# Patient Record
Sex: Female | Born: 1985 | Race: Black or African American | Hispanic: No | Marital: Single | Smoking: Never smoker
Health system: Southern US, Community
[De-identification: ages and names within clinical notes are randomized; demographics above are authoritative.]

## PROBLEM LIST (undated history)

## (undated) DIAGNOSIS — J45909 Unspecified asthma, uncomplicated: Secondary | ICD-10-CM

---

## 2007-09-08 ENCOUNTER — Emergency Department (HOSPITAL_COMMUNITY): Admission: EM | Admit: 2007-09-08 | Discharge: 2007-09-08 | Payer: Self-pay | Admitting: Emergency Medicine

## 2008-09-05 ENCOUNTER — Emergency Department (HOSPITAL_COMMUNITY): Admission: EM | Admit: 2008-09-05 | Discharge: 2008-09-06 | Payer: Self-pay | Admitting: Emergency Medicine

## 2009-11-07 ENCOUNTER — Emergency Department (HOSPITAL_COMMUNITY): Admission: EM | Admit: 2009-11-07 | Discharge: 2009-11-07 | Payer: Self-pay | Admitting: Family Medicine

## 2010-01-22 ENCOUNTER — Emergency Department (HOSPITAL_COMMUNITY): Admission: EM | Admit: 2010-01-22 | Discharge: 2010-01-22 | Payer: Self-pay | Admitting: Emergency Medicine

## 2010-05-20 ENCOUNTER — Emergency Department (HOSPITAL_COMMUNITY): Admission: EM | Admit: 2010-05-20 | Discharge: 2010-05-20 | Payer: Self-pay | Admitting: Family Medicine

## 2011-03-15 LAB — POCT URINALYSIS DIP (DEVICE)
Glucose, UA: NEGATIVE mg/dL
Ketones, ur: NEGATIVE mg/dL
Protein, ur: NEGATIVE mg/dL
Urobilinogen, UA: 2 mg/dL — ABNORMAL HIGH (ref 0.0–1.0)

## 2011-03-15 LAB — POCT PREGNANCY, URINE: Preg Test, Ur: NEGATIVE

## 2011-03-15 LAB — WET PREP, GENITAL: Yeast Wet Prep HPF POC: NONE SEEN

## 2011-03-15 LAB — GC/CHLAMYDIA PROBE AMP, GENITAL: Chlamydia, DNA Probe: NEGATIVE

## 2011-03-16 LAB — WET PREP, GENITAL
Trich, Wet Prep: NONE SEEN
Yeast Wet Prep HPF POC: NONE SEEN

## 2011-03-16 LAB — POCT URINALYSIS DIP (DEVICE)
Bilirubin Urine: NEGATIVE
Glucose, UA: NEGATIVE mg/dL
Hgb urine dipstick: NEGATIVE
Ketones, ur: NEGATIVE mg/dL
Nitrite: NEGATIVE
Protein, ur: NEGATIVE mg/dL
Specific Gravity, Urine: 1.025 (ref 1.005–1.030)
Urobilinogen, UA: 0.2 mg/dL (ref 0.0–1.0)
pH: 6 (ref 5.0–8.0)

## 2011-03-16 LAB — GC/CHLAMYDIA PROBE AMP, GENITAL
Chlamydia, DNA Probe: NEGATIVE
GC Probe Amp, Genital: NEGATIVE

## 2011-03-16 LAB — POCT PREGNANCY, URINE: Preg Test, Ur: NEGATIVE

## 2011-04-26 ENCOUNTER — Emergency Department (HOSPITAL_COMMUNITY)
Admission: EM | Admit: 2011-04-26 | Discharge: 2011-04-27 | Disposition: A | Discharge status: Home / Self Care | Payer: Self-pay | Attending: Emergency Medicine | Admitting: Emergency Medicine

## 2011-04-26 DIAGNOSIS — F3289 Other specified depressive episodes: Secondary | ICD-10-CM | POA: Insufficient documentation

## 2011-04-26 DIAGNOSIS — F329 Major depressive disorder, single episode, unspecified: Secondary | ICD-10-CM | POA: Insufficient documentation

## 2011-04-26 DIAGNOSIS — E876 Hypokalemia: Secondary | ICD-10-CM | POA: Insufficient documentation

## 2011-04-26 LAB — URINALYSIS, ROUTINE W REFLEX MICROSCOPIC
Nitrite: NEGATIVE
Specific Gravity, Urine: 1.011 (ref 1.005–1.030)
pH: 7.5 (ref 5.0–8.0)

## 2011-04-26 LAB — CBC
MCH: 23.1 pg — ABNORMAL LOW (ref 26.0–34.0)
MCV: 73 fL — ABNORMAL LOW (ref 78.0–100.0)
Platelets: 281 10*3/uL (ref 150–400)
RBC: 5.29 MIL/uL — ABNORMAL HIGH (ref 3.87–5.11)

## 2011-04-26 LAB — DIFFERENTIAL
Eosinophils Absolute: 0.1 10*3/uL (ref 0.0–0.7)
Eosinophils Relative: 3 % (ref 0–5)
Lymphs Abs: 2.5 10*3/uL (ref 0.7–4.0)
Monocytes Relative: 7 % (ref 3–12)
Neutrophils Relative %: 41 % — ABNORMAL LOW (ref 43–77)

## 2011-04-26 LAB — COMPREHENSIVE METABOLIC PANEL
Albumin: 3.6 g/dL (ref 3.5–5.2)
BUN: 7 mg/dL (ref 6–23)
CO2: 22 mEq/L (ref 19–32)
Chloride: 105 mEq/L (ref 96–112)
Creatinine, Ser: 0.76 mg/dL (ref 0.4–1.2)
GFR calc non Af Amer: >60 mL/min (ref >60)
Glucose, Bld: 81 mg/dL (ref 70–99)
Total Bilirubin: 0.4 mg/dL (ref 0.3–1.2)

## 2011-04-26 LAB — POCT PREGNANCY, URINE: Preg Test, Ur: NEGATIVE

## 2011-04-26 LAB — RAPID URINE DRUG SCREEN, HOSP PERFORMED
Cocaine: NOT DETECTED
Opiates: NOT DETECTED

## 2011-04-26 LAB — ETHANOL: Alcohol, Ethyl (B): <5 mg/dL (ref 0–10)

## 2011-09-30 LAB — POCT I-STAT, CHEM 8
BUN: 26 — ABNORMAL HIGH
Creatinine, Ser: 0.9
Potassium: 4
Sodium: 138

## 2011-09-30 LAB — D-DIMER, QUANTITATIVE: D-Dimer, Quant: 0.33

## 2011-09-30 LAB — URINALYSIS, ROUTINE W REFLEX MICROSCOPIC
Bilirubin Urine: NEGATIVE
Nitrite: NEGATIVE
Protein, ur: NEGATIVE
Specific Gravity, Urine: 1.021
Urobilinogen, UA: 1

## 2011-09-30 LAB — POCT CARDIAC MARKERS
CKMB, poc: 1.2
Myoglobin, poc: 33.7
Troponin i, poc: <0.05

## 2011-09-30 LAB — DIFFERENTIAL
Lymphocytes Relative: 44
Lymphs Abs: 2.5
Monocytes Relative: 7
Neutro Abs: 2.5
Neutrophils Relative %: 44

## 2011-09-30 LAB — POCT PREGNANCY, URINE: Preg Test, Ur: NEGATIVE

## 2011-09-30 LAB — URINE MICROSCOPIC-ADD ON

## 2011-09-30 LAB — CBC
RBC: 5.32 — ABNORMAL HIGH
WBC: 5.7

## 2012-03-20 ENCOUNTER — Emergency Department (HOSPITAL_COMMUNITY): Payer: BC Managed Care – PPO

## 2012-03-20 ENCOUNTER — Encounter (HOSPITAL_COMMUNITY): Payer: Self-pay | Admitting: *Deleted

## 2012-03-20 ENCOUNTER — Emergency Department (HOSPITAL_COMMUNITY)
Admission: EM | Admit: 2012-03-20 | Discharge: 2012-03-20 | Disposition: A | Discharge status: Home / Self Care | Payer: BC Managed Care – PPO | Attending: Emergency Medicine | Admitting: Emergency Medicine

## 2012-03-20 DIAGNOSIS — M545 Low back pain, unspecified: Secondary | ICD-10-CM | POA: Insufficient documentation

## 2012-03-20 MED ORDER — IBUPROFEN 800 MG PO TABS: 800.0000 mg | ORAL_TABLET | Freq: Three times a day (TID) | ORAL | Status: AC

## 2012-03-20 MED ORDER — DIAZEPAM 5 MG PO TABS: 5.0000 mg | ORAL_TABLET | Freq: Two times a day (BID) | ORAL | Status: AC
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
## 2012-03-20 NOTE — ED Provider Notes (Signed)
Medical screening examination/treatment/procedure(s) were performed by non-physician practitioner and as supervising physician I was immediately available for consultation/collaboration.   Carleene Cooper III, MD 03/20/12 1700
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
## 2012-09-17 ENCOUNTER — Encounter (HOSPITAL_COMMUNITY): Payer: Self-pay | Admitting: Emergency Medicine

## 2012-09-17 ENCOUNTER — Emergency Department (INDEPENDENT_AMBULATORY_CARE_PROVIDER_SITE_OTHER)
Admission: EM | Admit: 2012-09-17 | Discharge: 2012-09-17 | Disposition: A | Discharge status: Home / Self Care | Payer: BC Managed Care – PPO | Source: Home / Self Care

## 2012-09-17 DIAGNOSIS — R079 Chest pain, unspecified: Secondary | ICD-10-CM

## 2012-09-17 DIAGNOSIS — R0989 Other specified symptoms and signs involving the circulatory and respiratory systems: Secondary | ICD-10-CM

## 2012-09-17 DIAGNOSIS — R064 Hyperventilation: Secondary | ICD-10-CM

## 2012-09-17 DIAGNOSIS — R002 Palpitations: Secondary | ICD-10-CM

## 2012-09-17 DIAGNOSIS — R0600 Dyspnea, unspecified: Secondary | ICD-10-CM

## 2012-09-17 DIAGNOSIS — R0609 Other forms of dyspnea: Secondary | ICD-10-CM

## 2012-09-17 HISTORY — DX: Unspecified asthma, uncomplicated: J45.909

## 2012-09-17 MED ORDER — PROPRANOLOL HCL 10 MG PO TABS: 10.0000 mg | ORAL_TABLET | Freq: Three times a day (TID) | ORAL | Status: DC

## 2012-09-17 NOTE — ED Provider Notes (Signed)
History     CSN: [id number]  Arrival date & time 09/17/12  1429   None     Chief Complaint  Patient presents with  . 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 Yesterday she experienced an episode of shortness of breath that was worse with talking this occurs mostly when she zonal phone at other times as well. Yesterday's episode was associated with palpitations and chest pain is described as heavy and sharp and fleeting. As this group of symptoms progress she did develop tingling in the extremities and began to feel lightheaded as if she was going to faint. All of these symptoms occur primarily at work but she has also experienced a few at home. Once while just lying down on the bed and another time watching television. She describes the palpitations as an irregular heartbeat.  After she improved with her symptoms yesterday some of the same symptoms began as she restarted to work the following day. She denies that she is under excessive stress at work.   Past Medical History  Diagnosis Date  . Asthma     childhood asthma    History reviewed. No pertinent past surgical history.  Family History  Problem Relation Age of Onset  . Rheum arthritis Other   . Thyroid disease Other   . Hypertension Other     History  Substance Use Topics  . Smoking status: Never Smoker   . Smokeless tobacco: Never Used  . Alcohol Use: Yes     socially    OB History    Grav Para Term Preterm Abortions TAB SAB Ect Mult Living                  Review of Systems  Constitutional: Negative for chills, activity change, appetite change and fatigue.  HENT: Negative.   Eyes: Negative.   Respiratory: Positive for chest tightness and shortness of breath. Negative for cough, wheezing and stridor.     Cardiovascular: Positive for chest pain and palpitations. Negative for leg swelling.  Gastrointestinal: Negative.   Genitourinary: Negative.   Skin: Negative.   Neurological: Positive for dizziness, weakness, light-headedness and numbness. Negative for tremors, seizures, syncope, facial asymmetry, speech difficulty and headaches.    Allergies  Review of patient's allergies indicates no known allergies.  Home Medications   Current Outpatient Rx  Name Route Sig Dispense Refill  . LEVONORGESTREL-ETHINYL ESTRAD 0.15-30 MG-MCG PO TABS Oral Take 1 tablet by mouth daily.    . ADULT MULTIVITAMIN W/MINERALS CH Oral Take 1 tablet by mouth daily.    Marland Kitchen PROPRANOLOL HCL 10 MG PO TABS Oral Take 1 tablet (10 mg total) by mouth 3 (three) times daily. 90 tablet 0    BP 125/71  Pulse 81  Temp 98.8 F (37.1 C) (Oral)  Resp 18  SpO2 100%  LMP 09/16/2012  Physical Exam  Constitutional: She is oriented to person, place, and time. She appears well-developed and well-nourished. No distress.  HENT:  Head: Normocephalic and atraumatic.  Mouth/Throat: Oropharynx is clear and moist. No oropharyngeal exudate.  Eyes: Conjunctivae normal and EOM are normal. Pupils are equal, round, and reactive to light.  Neck: Normal range of motion. Neck supple. No JVD present. No thyromegaly present.  Cardiovascular: Normal rate and normal heart sounds.   Pulmonary/Chest: Effort normal and breath sounds normal. No  respiratory distress. She has no wheezes. She has no rales. She exhibits tenderness.  Abdominal: Soft. There is no tenderness.  Musculoskeletal: Normal range of motion. She exhibits no edema and no tenderness.  Lymphadenopathy:    She has no cervical adenopathy.  Neurological: She is alert and oriented to person, place, and time. No cranial nerve deficit.  Skin: Skin is warm and dry. No rash noted. No erythema.  Psychiatric: She has a normal mood and affect.    ED Course  Procedures (including critical  care time)  Labs Reviewed - No data to display No results found.   1. Hyperventilation   2. Dyspnea   3. Heart palpitations   4. Chest pain       MDM  Reassurance Propanolol 10mg  tid prn. EKG: NSR, So ST-T changes, no signs of ischemia, no premature beats.  Refer to [facility]. May need TFT's and Event monitoring.  If reccurs go to ED so it can be monitored and diagnoses.         Hayden Rasmussen, NP 09/17/12 1649

## 2012-09-17 NOTE — ED Notes (Signed)
Pt c/o of sob since Friday. Experiencing tingling in hands,legs, and arms. Some chest tightness and feeling light headed. Pt states that these symptoms often come and go and tends to happen frequently. Pt states that symptoms are worse today having sharp pain off/on in chest and feels like heart is fluttering.

## 2012-09-18 NOTE — ED Provider Notes (Signed)
Medical screening examination/treatment/procedure(s) were performed by non-physician practitioner and as supervising physician I was immediately available for consultation/collaboration.  Leslee Home, M.D.   Reuben Likes, MD 09/18/12 [phone]

## 2013-01-24 ENCOUNTER — Ambulatory Visit (INDEPENDENT_AMBULATORY_CARE_PROVIDER_SITE_OTHER): Payer: BC Managed Care – PPO | Admitting: Physician Assistant

## 2013-01-24 VITALS — BP 130/76 | HR 71 | Temp 98.4°F | Resp 16

## 2013-01-24 DIAGNOSIS — Z23 Encounter for immunization: Secondary | ICD-10-CM

## 2013-01-24 DIAGNOSIS — Z0289 Encounter for other administrative examinations: Secondary | ICD-10-CM
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
## 2013-11-27 ENCOUNTER — Emergency Department (HOSPITAL_COMMUNITY)
Admission: EM | Admit: 2013-11-27 | Discharge: 2013-11-27 | Disposition: A | Discharge status: Home / Self Care | Payer: BC Managed Care – PPO | Attending: Emergency Medicine | Admitting: Emergency Medicine

## 2013-11-27 ENCOUNTER — Encounter (HOSPITAL_COMMUNITY): Payer: Self-pay | Admitting: Emergency Medicine

## 2013-11-27 DIAGNOSIS — R632 Polyphagia: Secondary | ICD-10-CM | POA: Insufficient documentation

## 2013-11-27 DIAGNOSIS — H538 Other visual disturbances: Secondary | ICD-10-CM | POA: Insufficient documentation

## 2013-11-27 DIAGNOSIS — Z79899 Other long term (current) drug therapy: Secondary | ICD-10-CM | POA: Insufficient documentation

## 2013-11-27 DIAGNOSIS — R42 Dizziness and giddiness: Secondary | ICD-10-CM | POA: Insufficient documentation

## 2013-11-27 DIAGNOSIS — R631 Polydipsia: Secondary | ICD-10-CM | POA: Insufficient documentation

## 2013-11-27 DIAGNOSIS — R635 Abnormal weight gain: Secondary | ICD-10-CM | POA: Insufficient documentation

## 2013-11-27 DIAGNOSIS — R55 Syncope and collapse: Secondary | ICD-10-CM

## 2013-11-27 DIAGNOSIS — R350 Frequency of micturition: Secondary | ICD-10-CM | POA: Insufficient documentation

## 2013-11-27 DIAGNOSIS — Z3202 Encounter for pregnancy test, result negative: Secondary | ICD-10-CM | POA: Insufficient documentation

## 2013-11-27 LAB — URINALYSIS, ROUTINE W REFLEX MICROSCOPIC
Glucose, UA: NEGATIVE mg/dL
Hgb urine dipstick: NEGATIVE
Leukocytes, UA: NEGATIVE
Protein, ur: NEGATIVE mg/dL
pH: 6 (ref 5.0–8.0)

## 2013-11-27 LAB — BASIC METABOLIC PANEL
CO2: 23 mEq/L (ref 19–32)
Calcium: 9.3 mg/dL (ref 8.4–10.5)
Chloride: 100 mEq/L (ref 96–112)
Glucose, Bld: 92 mg/dL (ref 70–99)
Sodium: 135 mEq/L (ref 135–145)

## 2013-11-27 LAB — CBC WITH DIFFERENTIAL/PLATELET
Basophils Absolute: 0 10*3/uL (ref 0.0–0.1)
Eosinophils Absolute: 0.2 10*3/uL (ref 0.0–0.7)
Lymphocytes Relative: 36 % (ref 12–46)
MCHC: 30.4 g/dL (ref 30.0–36.0)
Monocytes Relative: 6 % (ref 3–12)
Neutro Abs: 2.6 10*3/uL (ref 1.7–7.7)
Neutrophils Relative %: 54 % (ref 43–77)
Platelets: 356 10*3/uL (ref 150–400)
RDW: 15.6 % — ABNORMAL HIGH (ref 11.5–15.5)
WBC: 4.9 10*3/uL (ref 4.0–10.5)

## 2013-11-27 LAB — POCT PREGNANCY, URINE: Preg Test, Ur: NEGATIVE

## 2013-11-27 LAB — GLUCOSE, CAPILLARY: Glucose-Capillary: 86 mg/dL (ref 70–99)

## 2013-11-27 NOTE — ED Notes (Signed)
Patient c/o headache, near syncope, frequent urination, excessive thirst, and blurred vision. Patient reports a history of diabetes in her family.

## 2013-11-27 NOTE — ED Provider Notes (Signed)
CSN: [id number]     Arrival date & time 11/27/13  1141 History   First MD Initiated Contact with Patient 11/27/13 1345     Chief Complaint  Patient presents with  . Near Syncope  . Urinary Frequency   (Consider location/radiation/quality/duration/timing/severity/associated sxs/prior Treatment) HPI... episode of lightheadedness and dizziness at work today. Patient also complains of polydipsia and polyphagia.  Last menstrual period 2 weeks ago. No chronic health problems. She is normally healthy. Severity is mild. Nothing makes symptoms better or worse. No neuro deficits, fever, chills, dysuria.  Does complain of weight gain  Past Medical History  Diagnosis Date  . Asthma     childhood asthma   History reviewed. No pertinent past surgical history. Family History  Problem Relation Age of Onset  . Rheum arthritis Other   . Thyroid disease Other   . Hypertension Other    History  Substance Use Topics  . Smoking status: Never Smoker   . Smokeless tobacco: Never Used  . Alcohol Use: Yes     Comment: socially   OB History   Grav Para Term Preterm Abortions TAB SAB Ect Mult Living                 Review of Systems  All other systems reviewed and are negative.    Allergies  Review of patient's allergies indicates no known allergies.  Home Medications   Current Outpatient Rx  Name  Route  Sig  Dispense  Refill  . levonorgestrel-ethinyl estradiol (NORDETTE) 0.15-30 MG-MCG tablet   Oral   Take 1 tablet by mouth daily.         . Multiple Vitamin (MULITIVITAMIN WITH MINERALS) TABS   Oral   Take 1 tablet by mouth daily.          BP 134/84  Pulse 80  Temp(Src) 98.6 F (37 C) (Oral)  Resp 16  SpO2 100%  LMP 11/14/2013 Physical Exam  Nursing note and vitals reviewed. Constitutional: She is oriented to person, place, and time. She appears well-developed and well-nourished.  HENT:  Head: Normocephalic and atraumatic.  Eyes: Conjunctivae and EOM are normal. Pupils  are equal, round, and reactive to light.  Neck: Normal range of motion. Neck supple.  Cardiovascular: Normal rate, regular rhythm and normal heart sounds.   Pulmonary/Chest: Effort normal and breath sounds normal.  Abdominal: Soft. Bowel sounds are normal.  Musculoskeletal: Normal range of motion.  Neurological: She is alert and oriented to person, place, and time.  Skin: Skin is warm and dry.  Psychiatric: She has a normal mood and affect. Her behavior is normal.    ED Course  Procedures (including critical care time) Labs Review Labs Reviewed  BASIC METABOLIC PANEL - Abnormal; Notable for the following:    Potassium 3.4 (*)    All other components within normal limits  URINALYSIS, ROUTINE W REFLEX MICROSCOPIC - Abnormal; Notable for the following:    Color, Urine STRAW (*)    APPearance CLOUDY (*)    All other components within normal limits  CBC WITH DIFFERENTIAL - Abnormal; Notable for the following:    RBC 5.57 (*)    MCV 72.0 (*)    MCH 21.9 (*)    RDW 15.6 (*)    All other components within normal limits  GLUCOSE, CAPILLARY  POCT PREGNANCY, URINE   Imaging Review No results found.  EKG Interpretation    Date/Time:  Monday November 27 2013 13:56:54 EST Ventricular Rate:  77 PR Interval:  135  QRS Duration: 84 QT Interval:  369 QTC Calculation: 418 R Axis:   61 Text Interpretation:  Sinus arrhythmia No significant change since last tracing Confirmed by STEINL  MD, KEVIN ([id number]) on 11/27/2013 2:14:58 PM            MDM   1. Near syncope    Normal physical exam.  Patient does not appear ill.  Screening labs, urinalysis, pregnancy test, EKG all normal    Donnetta Hutching, MD 11/27/13 1452

## 2014-11-29 ENCOUNTER — Emergency Department (HOSPITAL_COMMUNITY): Payer: BC Managed Care – PPO

## 2014-11-29 ENCOUNTER — Encounter (HOSPITAL_COMMUNITY): Payer: Self-pay | Admitting: Emergency Medicine

## 2014-11-29 ENCOUNTER — Emergency Department (HOSPITAL_COMMUNITY)
Admission: EM | Admit: 2014-11-29 | Discharge: 2014-11-29 | Disposition: A | Discharge status: Home / Self Care | Payer: BC Managed Care – PPO | Attending: Emergency Medicine | Admitting: Emergency Medicine

## 2014-11-29 DIAGNOSIS — Z3202 Encounter for pregnancy test, result negative: Secondary | ICD-10-CM | POA: Diagnosis not present

## 2014-11-29 DIAGNOSIS — R5381 Other malaise: Secondary | ICD-10-CM

## 2014-11-29 DIAGNOSIS — Z79899 Other long term (current) drug therapy: Secondary | ICD-10-CM | POA: Insufficient documentation

## 2014-11-29 DIAGNOSIS — R079 Chest pain, unspecified: Secondary | ICD-10-CM | POA: Diagnosis not present

## 2014-11-29 DIAGNOSIS — J45901 Unspecified asthma with (acute) exacerbation: Secondary | ICD-10-CM | POA: Diagnosis not present

## 2014-11-29 DIAGNOSIS — R1011 Right upper quadrant pain: Secondary | ICD-10-CM | POA: Diagnosis not present

## 2014-11-29 DIAGNOSIS — N390 Urinary tract infection, site not specified: Secondary | ICD-10-CM | POA: Insufficient documentation

## 2014-11-29 DIAGNOSIS — Z793 Long term (current) use of hormonal contraceptives: Secondary | ICD-10-CM | POA: Diagnosis not present

## 2014-11-29 DIAGNOSIS — R0602 Shortness of breath: Secondary | ICD-10-CM

## 2014-11-29 DIAGNOSIS — M791 Myalgia: Secondary | ICD-10-CM | POA: Insufficient documentation

## 2014-11-29 DIAGNOSIS — R110 Nausea: Secondary | ICD-10-CM | POA: Diagnosis present

## 2014-11-29 LAB — CBC WITH DIFFERENTIAL/PLATELET
BASOS ABS: 0 10*3/uL (ref 0.0–0.1)
Basophils Relative: 0 % (ref 0–1)
EOS PCT: 1 % (ref 0–5)
Eosinophils Absolute: 0.1 10*3/uL (ref 0.0–0.7)
HEMATOCRIT: 43.1 % (ref 36.0–46.0)
Hemoglobin: 13.8 g/dL (ref 12.0–15.0)
LYMPHS PCT: 25 % (ref 12–46)
Lymphs Abs: 2.2 10*3/uL (ref 0.7–4.0)
MCH: 25 pg — ABNORMAL LOW (ref 26.0–34.0)
MCHC: 32 g/dL (ref 30.0–36.0)
MCV: 78.1 fL (ref 78.0–100.0)
MONO ABS: 0.7 10*3/uL (ref 0.1–1.0)
MONOS PCT: 7 % (ref 3–12)
Neutro Abs: 6 10*3/uL (ref 1.7–7.7)
Neutrophils Relative %: 67 % (ref 43–77)
Platelets: 265 10*3/uL (ref 150–400)
RBC: 5.52 MIL/uL — ABNORMAL HIGH (ref 3.87–5.11)
RDW: 15 % (ref 11.5–15.5)
WBC: 9 10*3/uL (ref 4.0–10.5)

## 2014-11-29 LAB — COMPREHENSIVE METABOLIC PANEL
ALT: 14 U/L (ref 0–35)
ANION GAP: 13 (ref 5–15)
AST: 27 U/L (ref 0–37)
Albumin: 3.6 g/dL (ref 3.5–5.2)
Alkaline Phosphatase: 40 U/L (ref 39–117)
BUN: 7 mg/dL (ref 6–23)
CALCIUM: 9.2 mg/dL (ref 8.4–10.5)
CO2: 23 meq/L (ref 19–32)
CREATININE: 0.81 mg/dL (ref 0.50–1.10)
Chloride: 101 mEq/L (ref 96–112)
Glucose, Bld: 84 mg/dL (ref 70–99)
Potassium: 4.5 mEq/L (ref 3.7–5.3)
Sodium: 137 mEq/L (ref 137–147)
Total Bilirubin: 0.2 mg/dL — ABNORMAL LOW (ref 0.3–1.2)
Total Protein: 7.6 g/dL (ref 6.0–8.3)

## 2014-11-29 LAB — URINALYSIS, ROUTINE W REFLEX MICROSCOPIC
Bilirubin Urine: NEGATIVE
GLUCOSE, UA: NEGATIVE mg/dL
Ketones, ur: NEGATIVE mg/dL
Leukocytes, UA: NEGATIVE
Nitrite: NEGATIVE
PROTEIN: NEGATIVE mg/dL
Specific Gravity, Urine: 1.006 (ref 1.005–1.030)
UROBILINOGEN UA: 0.2 mg/dL (ref 0.0–1.0)
pH: 6.5 (ref 5.0–8.0)

## 2014-11-29 LAB — URINE MICROSCOPIC-ADD ON

## 2014-11-29 LAB — POC URINE PREG, ED: PREG TEST UR: NEGATIVE

## 2014-11-29 MED ORDER — CIPROFLOXACIN HCL 500 MG PO TABS
500.0000 mg | ORAL_TABLET | Freq: Two times a day (BID) | ORAL | Status: AC
Start: 2014-11-29 — End: ?

## 2014-11-29 MED ORDER — SODIUM CHLORIDE 0.9 % IV BOLUS (SEPSIS)
1000.0000 mL | Freq: Once | INTRAVENOUS | Status: AC
  Administered 2014-11-29: 1000 mL via INTRAVENOUS

## 2014-11-29 MED ORDER — KETOROLAC TROMETHAMINE 30 MG/ML IJ SOLN
30.0000 mg | Freq: Once | INTRAMUSCULAR | Status: AC
  Administered 2014-11-29: 30 mg via INTRAVENOUS
  Filled 2014-11-29: qty 1

## 2014-11-29 MED ORDER — OXYCODONE-ACETAMINOPHEN 5-325 MG PO TABS: 1.0000 | ORAL_TABLET | Freq: Four times a day (QID) | ORAL | Status: AC | PRN

## 2014-11-29 MED ORDER — IOHEXOL 300 MG/ML  SOLN
100.0000 mL | Freq: Once | INTRAMUSCULAR | Status: AC | PRN
  Administered 2014-11-29: 100 mL via INTRAVENOUS

## 2014-11-29 NOTE — ED Provider Notes (Addendum)
CSN: [id number]     Arrival date & time 11/29/14  1353 History   First MD Initiated Contact with Patient 11/29/14 1450     Chief Complaint  Patient presents with  . Flank Pain    right  . Nausea     (Consider location/radiation/quality/duration/timing/severity/associated sxs/prior Treatment) Patient is a 28 y.o. female presenting with flank pain. The history is provided by the patient.  Flank Pain This is a new problem. The current episode started 3 to 5 hours ago. The problem occurs constantly. The problem has not changed since onset.Associated symptoms include chest pain, abdominal pain and shortness of breath. Pertinent negatives include no headaches. Nothing aggravates the symptoms. Nothing relieves the symptoms. She has tried nothing for the symptoms. The treatment provided no relief.    Past Medical History  Diagnosis Date  . Asthma     childhood asthma   History reviewed. No pertinent past surgical history. Family History  Problem Relation Age of Onset  . Rheum arthritis Other   . Thyroid disease Other   . Hypertension Other    History  Substance Use Topics  . Smoking status: Never Smoker   . Smokeless tobacco: Never Used  . Alcohol Use: Yes     Comment: socially   OB History    No data available     Review of Systems  Constitutional: Negative for fever and fatigue.  HENT: Negative for congestion and drooling.   Eyes: Negative for pain.  Respiratory: Positive for shortness of breath. Negative for cough.   Cardiovascular: Positive for chest pain.  Gastrointestinal: Positive for nausea and abdominal pain. Negative for vomiting and diarrhea.  Genitourinary: Positive for flank pain. Negative for dysuria and hematuria.  Musculoskeletal: Positive for myalgias. Negative for back pain, gait problem and neck pain.  Skin: Negative for color change.  Neurological: Negative for dizziness and headaches.  Hematological: Negative for adenopathy.  Psychiatric/Behavioral:  Negative for behavioral problems.  All other systems reviewed and are negative.     Allergies  Review of patient's allergies indicates no known allergies.  Home Medications   Prior to Admission medications   Medication Sig Start Date End Date Taking? Authorizing Provider  fluconazole (DIFLUCAN) 150 MG tablet Take 1 tablet by mouth once. 11/08/14  Yes Historical Provider, MD  hydrOXYzine (ATARAX/VISTARIL) 50 MG tablet Take 1 tablet by mouth every 6 (six) hours as needed. itching 11/19/14  Yes Historical Provider, MD  levonorgestrel-ethinyl estradiol (ALTAVERA) 0.15-30 MG-MCG tablet Take 1 tablet by mouth daily. 11/08/14  Yes Historical Provider, MD  Multiple Vitamin (MULITIVITAMIN WITH MINERALS) TABS Take 1 tablet by mouth daily.   Yes Historical Provider, MD  predniSONE (STERAPRED UNI-PAK) 10 MG tablet Take 1 tablet by mouth as directed. tapper down 11/19/14  Yes Historical Provider, MD   BP 153/96 mmHg  Pulse 80  Temp(Src) 98 F (36.7 C) (Oral)  Resp 20  SpO2 100%  LMP 11/07/2014 Physical Exam  Constitutional: She is oriented to person, place, and time. She appears well-developed and well-nourished.  HENT:  Head: Normocephalic and atraumatic.  Mouth/Throat: Oropharynx is clear and moist. No oropharyngeal exudate.  Eyes: Conjunctivae and EOM are normal. Pupils are equal, round, and reactive to light.  Neck: Normal range of motion. Neck supple.  Cardiovascular: Normal rate, regular rhythm, normal heart sounds and intact distal pulses.  Exam reveals no gallop and no friction rub.   No murmur heard. Pulmonary/Chest: Effort normal and breath sounds normal. No respiratory distress. She has no wheezes.  Abdominal: Soft. Bowel sounds are normal. There is tenderness (mild ttp of RUQ). There is no rebound and no guarding.  Musculoskeletal: Normal range of motion. She exhibits tenderness (nonspecific tenderness to bilateral upper extremities.). She exhibits no edema.  Normal symmetric  lower extremities without focal tenderness.  Neurological: She is alert and oriented to person, place, and time.  Skin: Skin is warm and dry.  Psychiatric: She has a normal mood and affect. Her behavior is normal.  Nursing note and vitals reviewed.   ED Course  Procedures (including critical care time) Labs Review Labs Reviewed  CBC WITH DIFFERENTIAL - Abnormal; Notable for the following:    RBC 5.52 (*)    MCH 25.0 (*)    All other components within normal limits  COMPREHENSIVE METABOLIC PANEL - Abnormal; Notable for the following:    Total Bilirubin 0.2 (*)    All other components within normal limits  URINALYSIS, ROUTINE W REFLEX MICROSCOPIC - Abnormal; Notable for the following:    Hgb urine dipstick MODERATE (*)    All other components within normal limits  URINE MICROSCOPIC-ADD ON - Abnormal; Notable for the following:    Squamous Epithelial / LPF FEW (*)    Bacteria, UA MANY (*)    All other components within normal limits  URINE CULTURE  POC URINE PREG, ED    Imaging Review Dg Chest 2 View  11/29/2014   CLINICAL DATA:  Shortness of breath.  Chest pain.  EXAM: CHEST  2 VIEW  COMPARISON:  March 20, 2012.  FINDINGS: The heart size and mediastinal contours are within normal limits. Both lungs are clear. No pneumothorax or pleural effusion is noted. The visualized skeletal structures are unremarkable.  IMPRESSION: No acute cardiopulmonary abnormality seen.   Electronically Signed   By: Roque LiasJames  Green M.D.   On: 11/29/2014 16:31   Ct Abdomen W Contrast  11/29/2014   CLINICAL DATA:  28 year old female with acute right upper quadrant abdominal pain. Upper limits of normal CBD caliber on recent ultrasound. Initial encounter.  EXAM: CT ABDOMEN WITH CONTRAST  TECHNIQUE: Multidetector CT imaging of the abdomen was performed using the standard protocol following bolus administration of intravenous contrast.  CONTRAST:  100mL OMNIPAQUE IOHEXOL 300 MG/ML  SOLN  COMPARISON:  11/29/2014  ultrasound  FINDINGS: The liver, spleen, adrenal glands, gallbladder, kidneys and pancreas are unremarkable.  There is no evidence of CBD or intrahepatic biliary dilatation on this study. There is no evidence of filling defect within the CBD.  The pancreatic duct is normal in caliber.  There is no evidence of free fluid, enlarged lymph nodes, biliary dilation or abdominal aortic aneurysm.  No acute or suspicious bony abnormalities are identified.  A small to moderate central disc protrusion at L4-5 noted.  IMPRESSION: No evidence of biliary dilatation or CBD abnormality on this study.  No acute or significant abnormalities within the abdomen.  Small to moderate central disc protrusion at L4-5.   Electronically Signed   By: Laveda AbbeJeff  Hu M.D.   On: 11/29/2014 19:37   [place] Abdomen Limited Ruq  11/29/2014   CLINICAL DATA:  Right-sided back pain, nausea  EXAM: US ABDOMEN LIMITED - RIGHT UPPER QUADRANT  COMPARISON:  None.  FINDINGS: Gallbladder:  The gallbladder is visualized and no gallstones are noted. There is no pain over the gallbladder with compression.  Common bile duct:  Diameter: Common bile duct is within upper limits of normal for age measuring 6.8 mm in diameter.  Liver:  There is slight prominence  of intrahepatic ducts centrally, and there is some prominence of the pancreatic duct. In view of these findings a distal common bile duct calculus, stricture, or less likely a mass could not be excluded and CT with oral and IV contrast may be helpful to assess further.  IMPRESSION: 1. Common bile duct upper normal with slight dilatation of the intrahepatic ducts and the pancreatic duct. Cannot exclude a distal common bile duct calculus, stricture, or less likely a mass in this age group. Consider CT of the abdomen with oral and IV contrast media. 2. No gallstones.   Electronically Signed   By: Dwyane DeePaul  Barry M.D.   On: 11/29/2014 16:42     EKG Interpretation None      MDM   Final diagnoses:  SOB (shortness of  breath)  RUQ pain  Malaise  UTI (lower urinary tract infection)    3:39 PM 28 y.o. female He presents with multiple complaints. She states that she developed a rash on her face approximately 2 weeks ago. She was seen by her PCP who placed her on prednisone for approximately one week. She finished the prednisone 5 days ago. After finishing the prednisone she began having pains all over her body. She has had these pains constantly for the last 5 days. She denies any fevers. She states that she feels mildly short of breath. She states that she occasionally feels a fleeting chest pain. She has had similar chest pains in the past. She is afebrile and vital signs are unremarkable here. She appears well on exam and currently denies any chest pain. She saw her PCP several days ago who got screening lab work for lupus and RA. Her lab work is not back yet. She has focal tenderness in the right upper quadrant on my exam. We'll get screening labs and imaging. Low risk wells, cannot perc d/t BC but do not think this is a PE given vague intermittent sx, not tachy, normal VS, well appearing, no pleuritic pain. Potentially she is having malaise related to viral prodrome.   US suspicious and rads recommended CT. CT non-contrib. Pt continues to appear well. UA suspicious for UTI. Will tx w/ cipro given non-spec back pain.   8:15 PM:  I have discussed the diagnosis/risks/treatment options with the patient and believe the pt to be eligible for discharge home to follow-up with her pcp. We also discussed returning to the ED immediately if new or worsening sx occur. We discussed the sx which are most concerning (e.g., worsening pain, fever) that necessitate immediate return. Medications administered to the patient during their visit and any new prescriptions provided to the patient are listed below.  Medications given during this visit Medications  sodium chloride 0.9 % bolus 1,000 mL (1,000 mLs Intravenous New Bag/Given  11/29/14 1603)  ketorolac (TORADOL) 30 MG/ML injection 30 mg (30 mg Intravenous Given 11/29/14 1832)  iohexol (OMNIPAQUE) 300 MG/ML solution 100 mL (100 mLs Intravenous Contrast Given 11/29/14 1856)    New Prescriptions   CIPROFLOXACIN (CIPRO) 500 MG TABLET    Take 1 tablet (500 mg total) by mouth 2 (two) times daily. One po bid x 7 days   OXYCODONE-ACETAMINOPHEN (PERCOCET) 5-325 MG PER TABLET    Take 1 tablet by mouth every 6 (six) hours as needed for moderate pain.     Purvis SheffieldForrest Daking Westervelt, MD 11/29/14 [id number]  Purvis SheffieldForrest Cleatus Goodin, MD 11/29/14 2040

## 2014-11-29 NOTE — ED Notes (Addendum)
Pt states for a while she has been having "widespread pain" and went PCP and had test done on Tuesday and waiting for results to come back. Pt states even getting a BP hurts. Pt states today she had a increasing pain in left flank area along with nausea/ Pt also states she has been feeling like she is having vertigo and that has been persistent. Pt also c/o SOB, pt has multiple c/o and symptoms.

## 2014-11-29 NOTE — ED Notes (Signed)
I will get EKG once US Abdomen is finish with patient. Every time I attempt to get EKG someone is with patient

## 2014-11-29 NOTE — Discharge Instructions (Signed)
Abdominal Pain, Women °Abdominal (stomach, pelvic, or belly) pain can be caused by many things. It is important to tell your doctor: °· The location of the pain. °· Does it come and go or is it present all the time? °· Are there things that start the pain (eating certain foods, exercise)? °· Are there other symptoms associated with the pain (fever, nausea, vomiting, diarrhea)? °All of this is helpful to know when trying to find the cause of the pain. °CAUSES  °· Stomach: virus or bacteria infection, or ulcer. °· Intestine: appendicitis (inflamed appendix), regional ileitis (Crohn's disease), ulcerative colitis (inflamed colon), irritable bowel syndrome, diverticulitis (inflamed diverticulum of the colon), or cancer of the stomach or intestine. °· Gallbladder disease or stones in the gallbladder. °· Kidney disease, kidney stones, or infection. °· Pancreas infection or cancer. °· Fibromyalgia (pain disorder). °· Diseases of the female organs: °¨ Uterus: fibroid (non-cancerous) tumors or infection. °¨ Fallopian tubes: infection or tubal pregnancy. °¨ Ovary: cysts or tumors. °¨ Pelvic adhesions (scar tissue). °¨ Endometriosis (uterus lining tissue growing in the pelvis and on the pelvic organs). °¨ Pelvic congestion syndrome (female organs filling up with blood just before the menstrual period). °¨ Pain with the menstrual period. °¨ Pain with ovulation (producing an egg). °¨ Pain with an IUD (intrauterine device, birth control) in the uterus. °¨ Cancer of the female organs. °· Functional pain (pain not caused by a disease, may improve without treatment). °· Psychological pain. °· Depression. °DIAGNOSIS  °Your doctor will decide the seriousness of your pain by doing an examination. °· Blood tests. °· X-rays. °· Ultrasound. °· CT scan (computed tomography, special type of X-ray). °· MRI (magnetic resonance imaging). °· Cultures, for infection. °· Barium enema (dye inserted in the large intestine, to better view it with  X-rays). °· Colonoscopy (looking in intestine with a lighted tube). °· Laparoscopy (minor surgery, looking in abdomen with a lighted tube). °· Major abdominal exploratory surgery (looking in abdomen with a large incision). °TREATMENT  °The treatment will depend on the cause of the pain.  °· Many cases can be observed and treated at home. °· Over-the-counter medicines recommended by your caregiver. °· Prescription medicine. °· Antibiotics, for infection. °· Birth control pills, for painful periods or for ovulation pain. °· Hormone treatment, for endometriosis. °· Nerve blocking injections. °· Physical therapy. °· Antidepressants. °· Counseling with a psychologist or psychiatrist. °· Minor or major surgery. °HOME CARE INSTRUCTIONS  °· Do not take laxatives, unless directed by your caregiver. °· Take over-the-counter pain medicine only if ordered by your caregiver. Do not take aspirin because it can cause an upset stomach or bleeding. °· Try a clear liquid diet (broth or water) as ordered by your caregiver. Slowly move to a bland diet, as tolerated, if the pain is related to the stomach or intestine. °· Have a thermometer and take your temperature several times a day, and record it. °· Bed rest and sleep, if it helps the pain. °· Avoid sexual intercourse, if it causes pain. °· Avoid stressful situations. °· Keep your follow-up appointments and tests, as your caregiver orders. °· If the pain does not go away with medicine or surgery, you may try: °¨ Acupuncture. °¨ Relaxation exercises (yoga, meditation). °¨ Group therapy. °¨ Counseling. °SEEK MEDICAL CARE IF:  °· You notice certain foods cause stomach pain. °· Your home care treatment is not helping your pain. °· You need stronger pain medicine. °· You want your IUD removed. °· You feel faint or   lightheaded. °· You develop nausea and vomiting. °· You develop a rash. °· You are having side effects or an allergy to your medicine. °SEEK IMMEDIATE MEDICAL CARE IF:  °· Your  pain does not go away or gets worse. °· You have a fever. °· Your pain is felt only in portions of the abdomen. The right side could possibly be appendicitis. The left lower portion of the abdomen could be colitis or diverticulitis. °· You are passing blood in your stools (bright red or black tarry stools, with or without vomiting). °· You have blood in your urine. °· You develop chills, with or without a fever. °· You pass out. °MAKE SURE YOU:  °· Understand these instructions. °· Will watch your condition. °· Will get help right away if you are not doing well or get worse. °Document Released: 10/11/2007 Document Revised: 04/30/2014 Document Reviewed: 10/31/2009 °[employer]® Patient Information ©2015 [employer]. This information is not intended to replace advice given to you by your health care provider. Make sure you discuss any questions you have with your health care provider. ° °

## 2014-11-29 NOTE — ED Notes (Signed)
Bedside report received from previous RN, Asja 

## 2014-11-30 LAB — URINE CULTURE
COLONY COUNT: NO GROWTH
Culture: NO GROWTH

## 2015-05-20 IMAGING — CR DG CHEST 2V
2 series · 2 of 2 positions shown · non-contrast
Comparison: March 20, 2012.

CLINICAL DATA: Shortness of breath.  Chest pain.

EXAM:
CHEST  2 VIEW

[w chest pa]
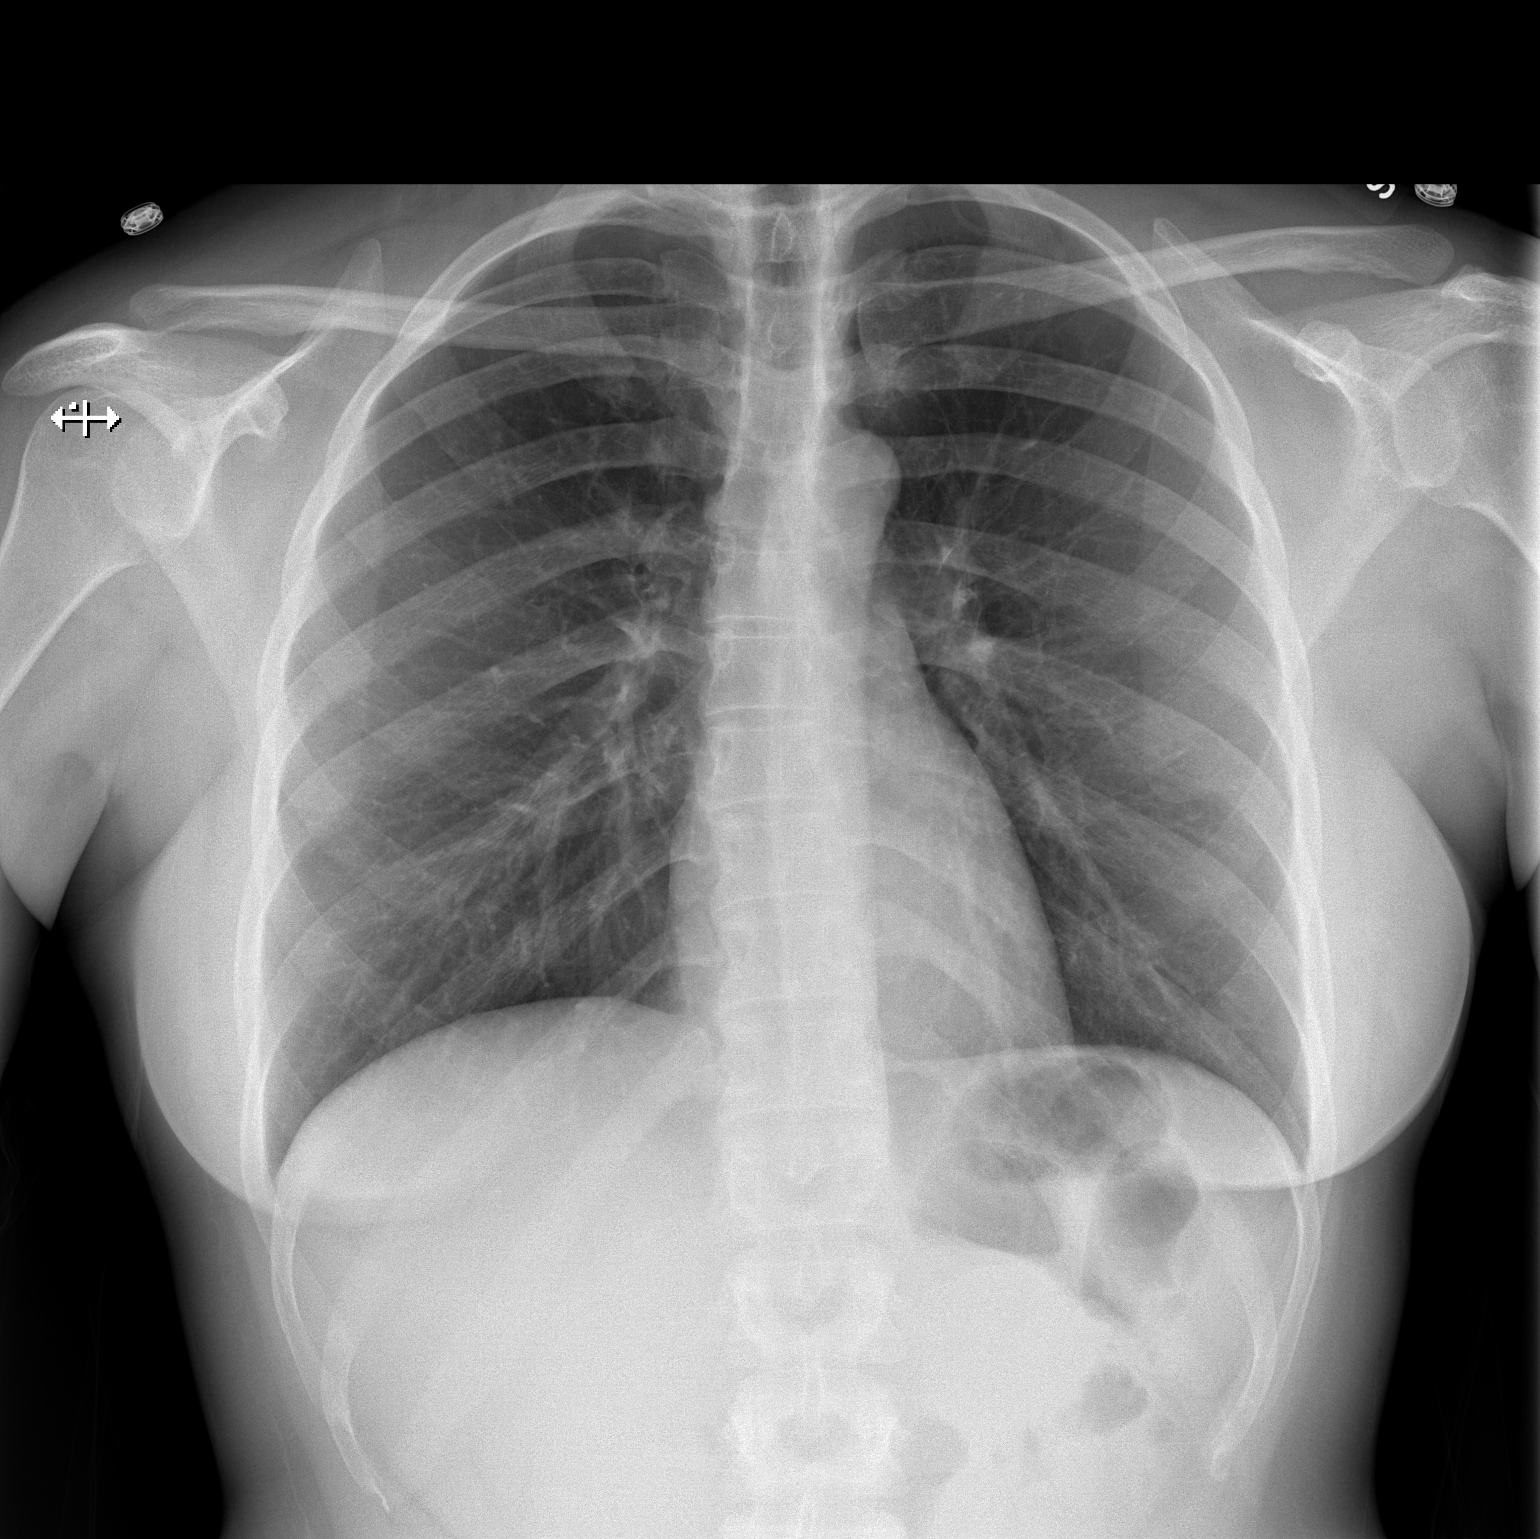

[w chest lat]
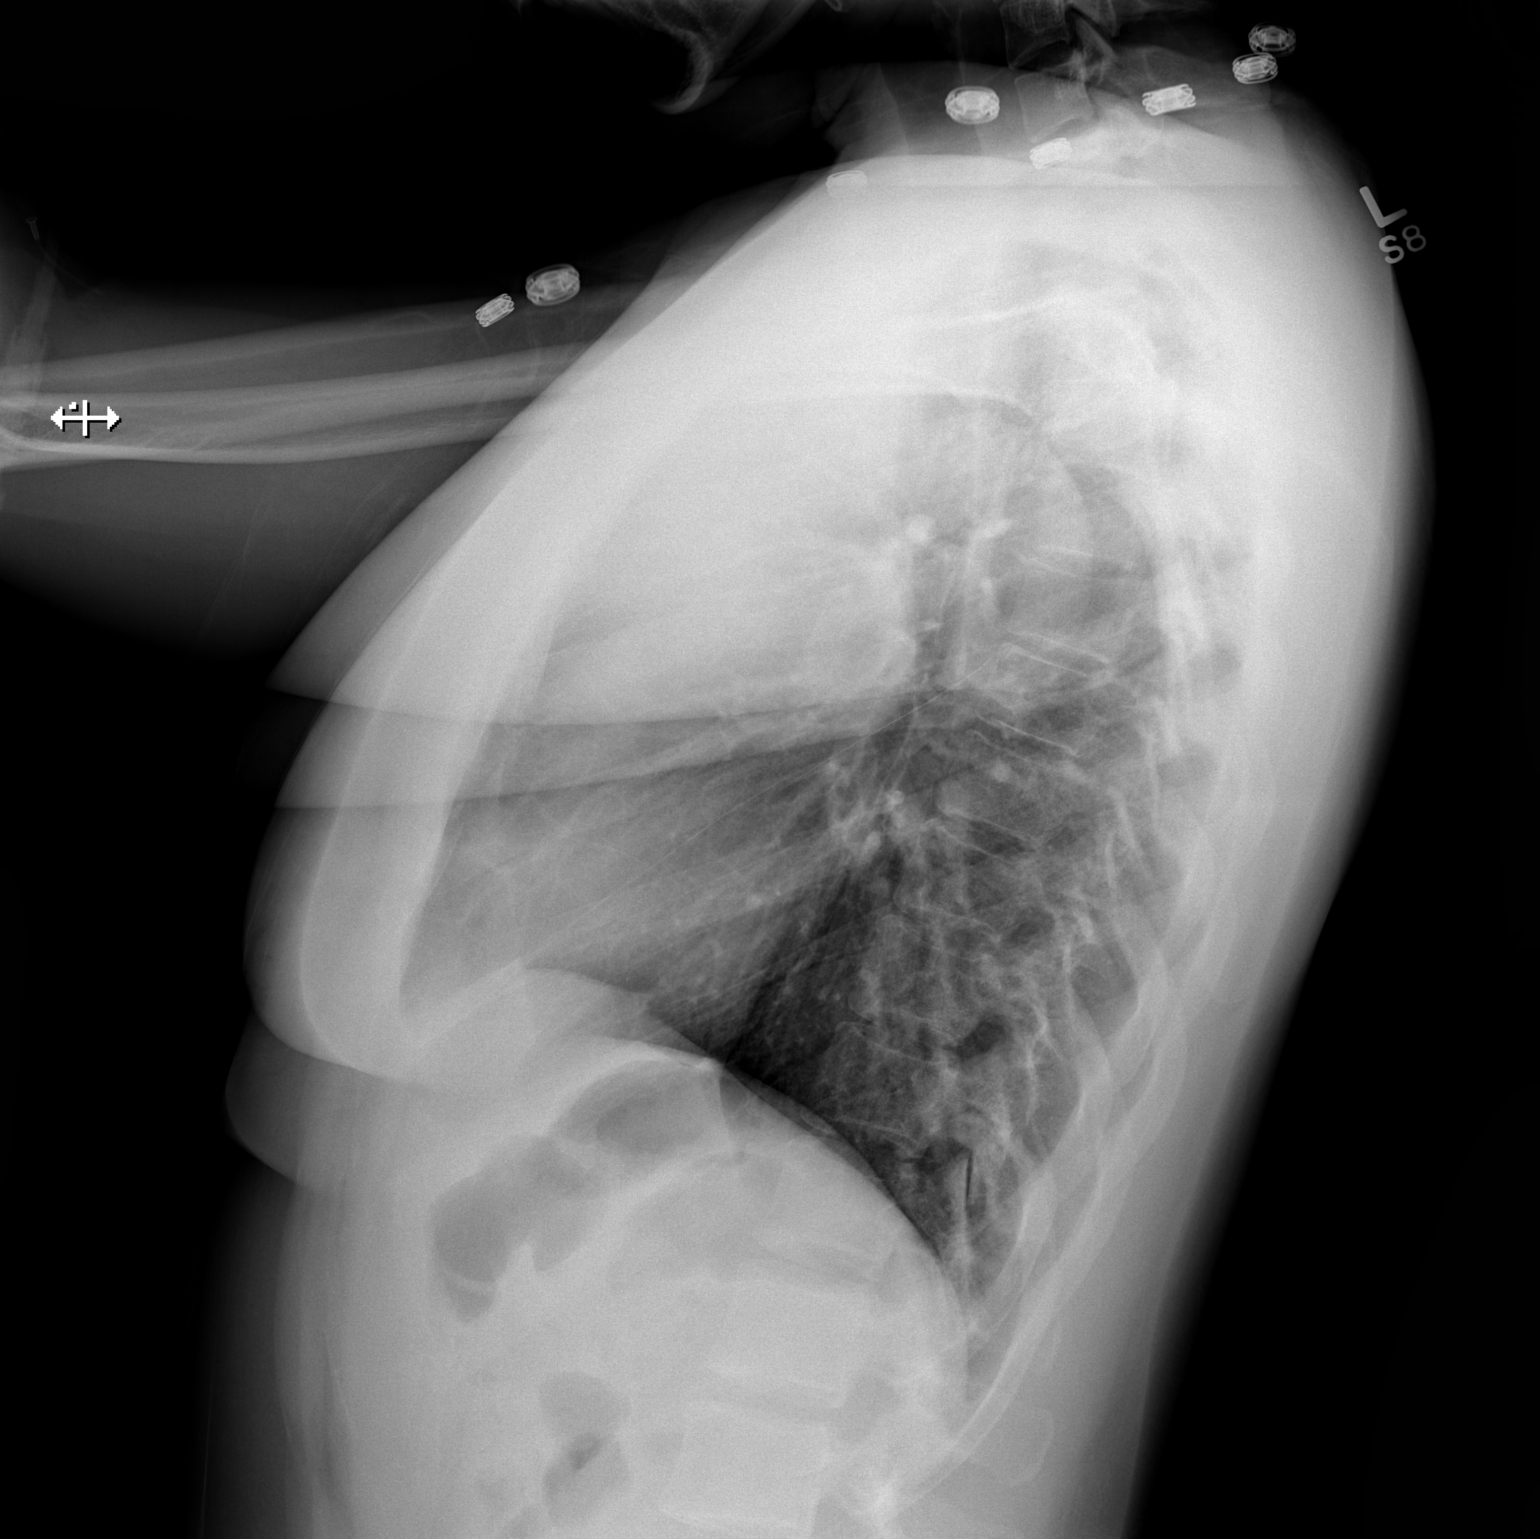

[2 of 2 positions shown; findings below may reference images not displayed]

FINDINGS: The heart size and mediastinal contours are within normal limits.
Both lungs are clear. No pneumothorax or pleural effusion is noted.
The visualized skeletal structures are unremarkable.
IMPRESSION: No acute cardiopulmonary abnormality seen.

## 2015-05-20 IMAGING — US US ABDOMEN LIMITED
1 series · 14 of 25 positions shown · non-contrast
Comparison: None.

CLINICAL DATA: Right-sided back pain, nausea

EXAM:
US ABDOMEN LIMITED - RIGHT UPPER QUADRANT

[Series 1: us abdomen limited · 0.22mm/px · 14 of 51 slices shown]
[im 1/51]
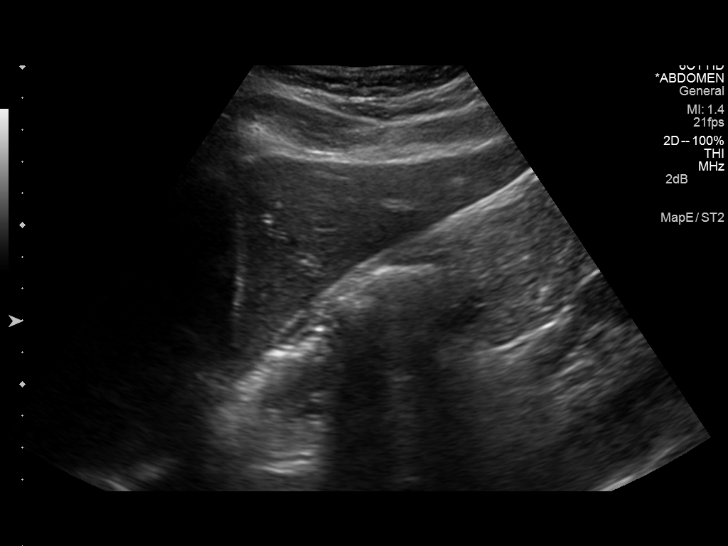
[im 5/51]
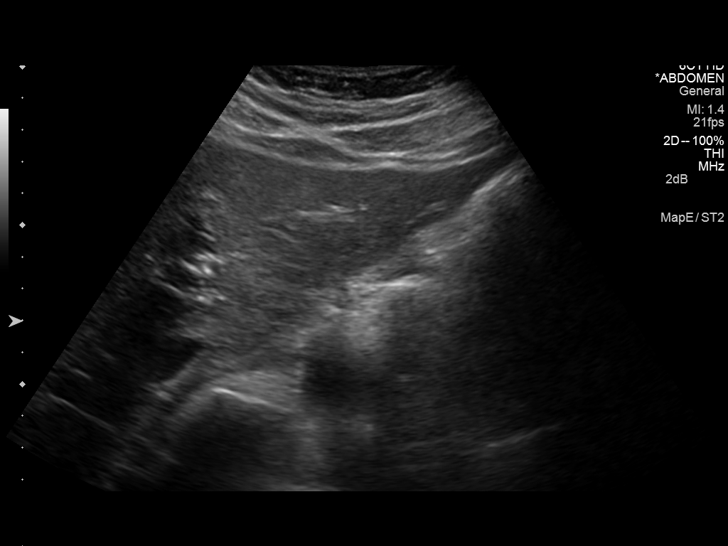
[im 9/51]
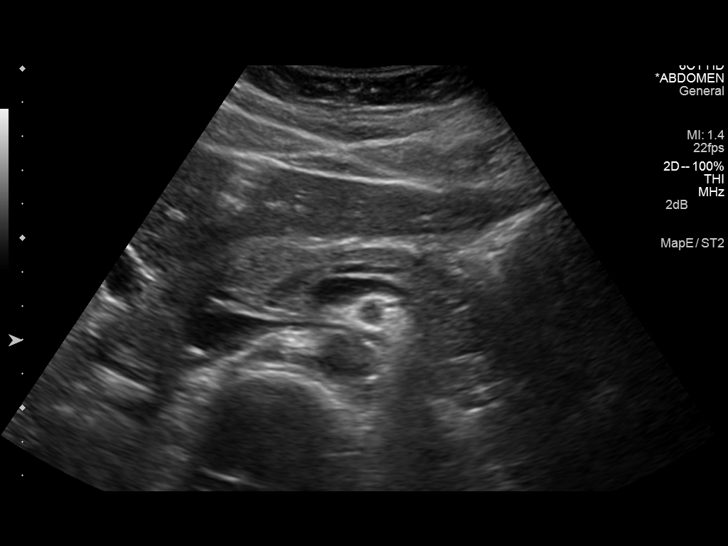
[im 13/51]
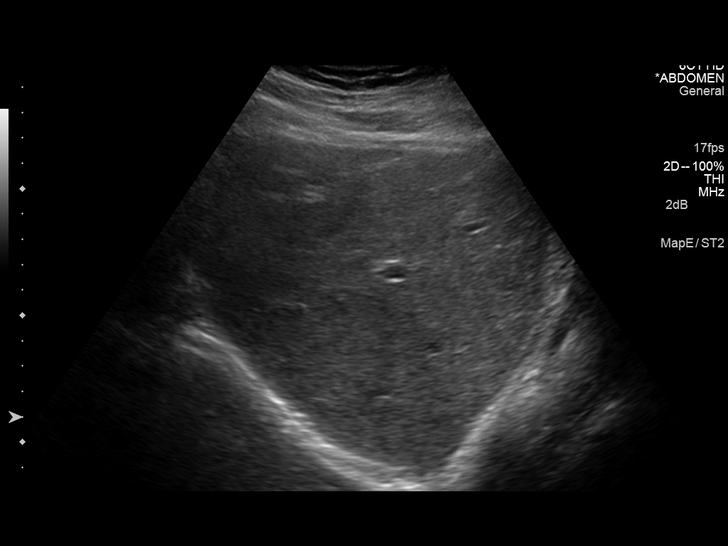
[im 17/51]
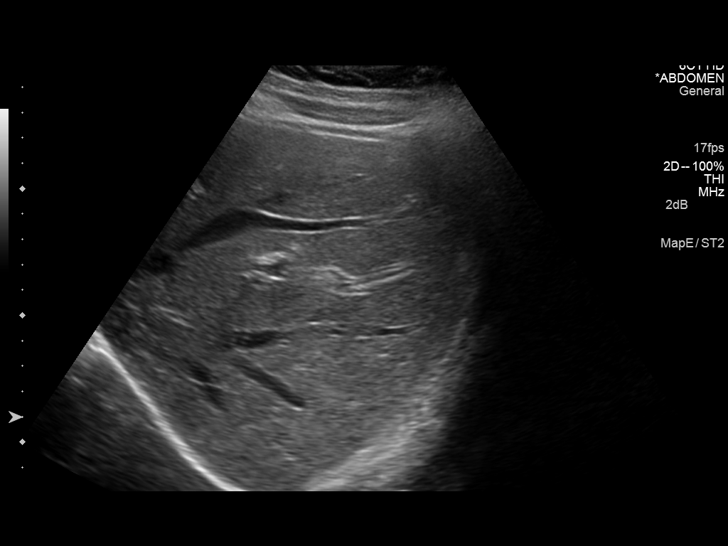
[im 19/51]
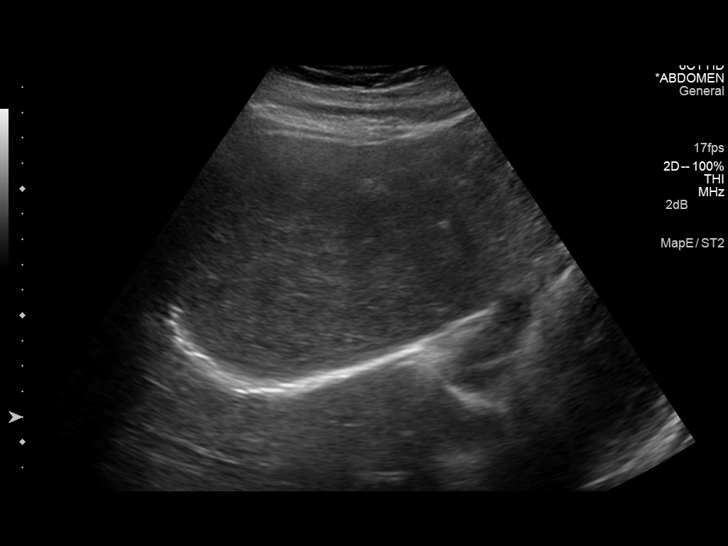
[im 23/51]
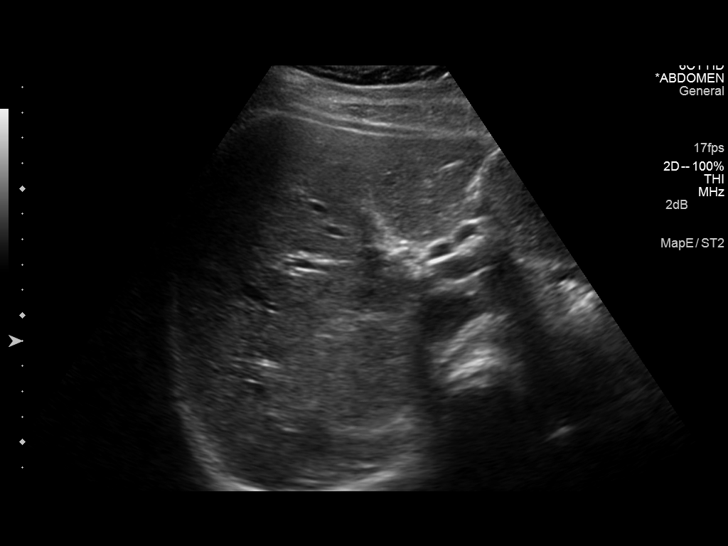
[im 28/51]
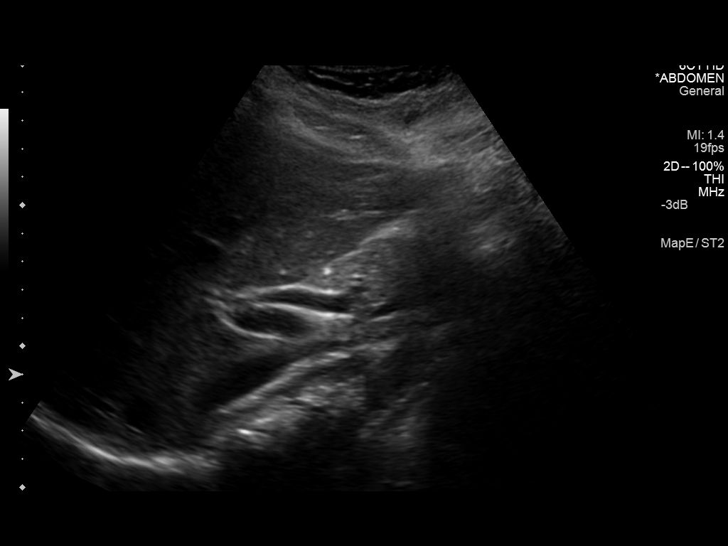
[im 32/51]
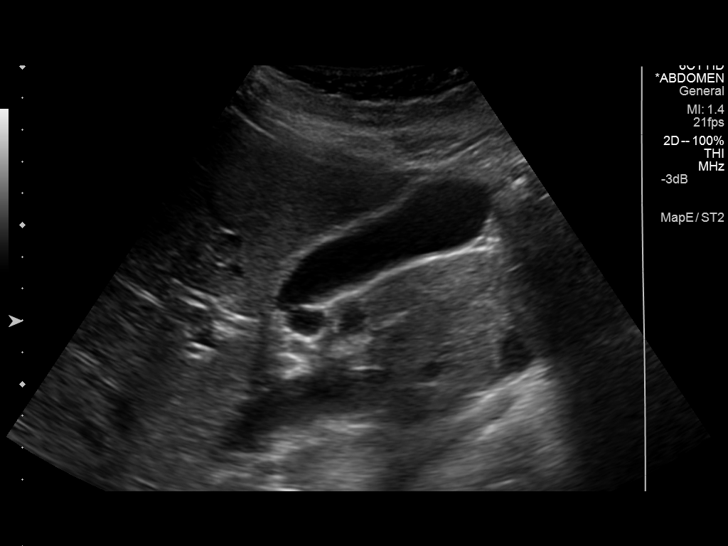
[im 34/51]
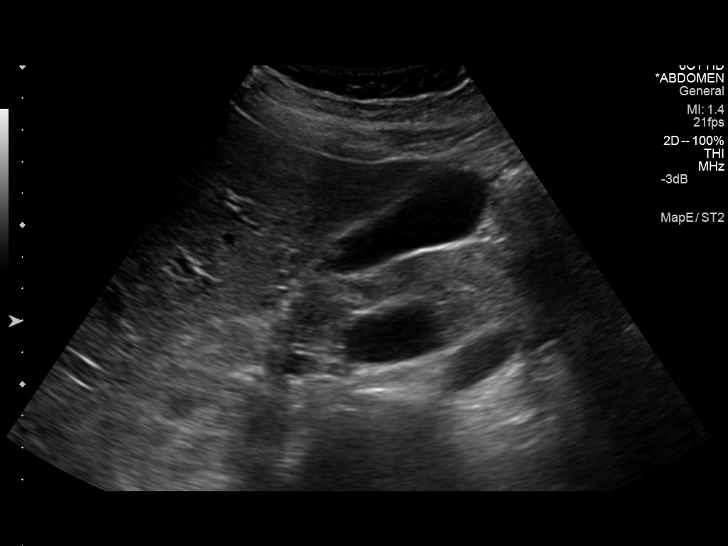
[im 38/51]
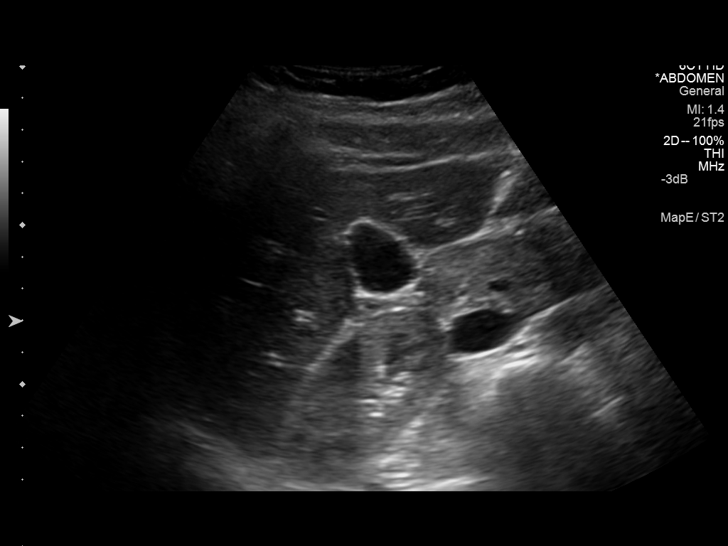
[im 42/51]
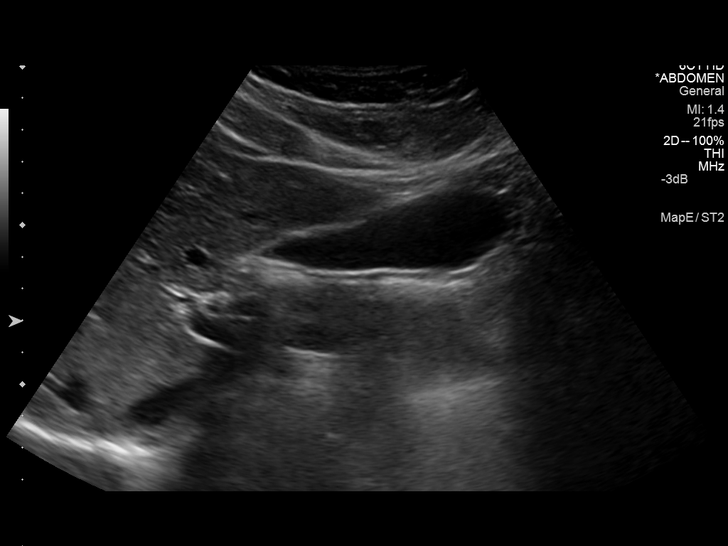
[im 46/51]
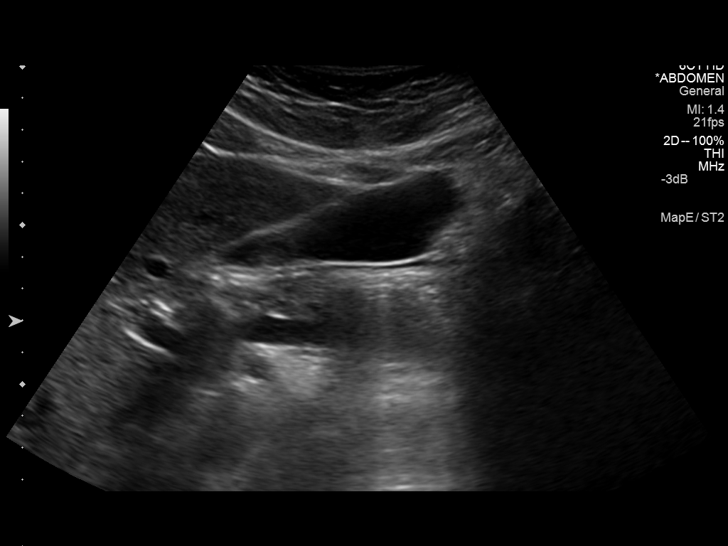
[im 51/51]
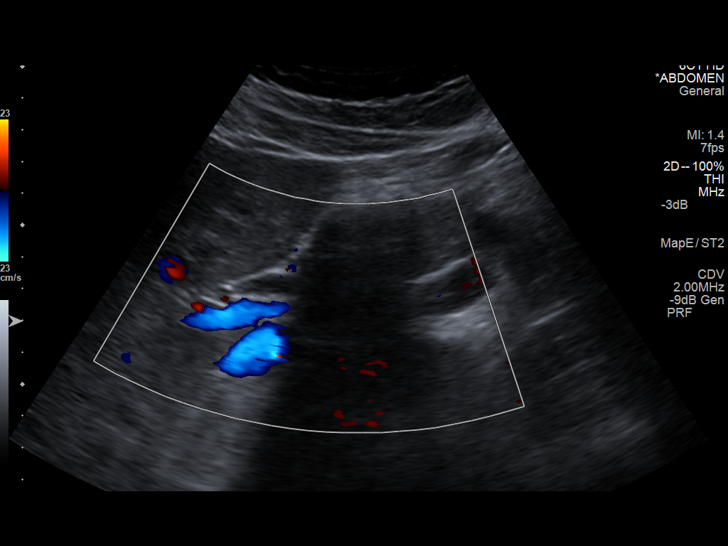

[14 of 25 positions shown; findings below may reference images not displayed]

FINDINGS: Gallbladder:

The gallbladder is visualized and no gallstones are noted. There is
no pain over the gallbladder with compression.

Common bile duct:

Diameter: Common bile duct is within upper limits of normal for age
measuring 6.8 mm in diameter.

Liver:

There is slight prominence of intrahepatic ducts centrally, and
there is some prominence of the pancreatic duct. In view of these
findings a distal common bile duct calculus, stricture, or less
likely a mass could not be excluded and CT with oral and IV contrast
may be helpful to assess further.
IMPRESSION: 1. Common bile duct upper normal with slight dilatation of the
intrahepatic ducts and the pancreatic duct. Cannot exclude a distal
common bile duct calculus, stricture, or less likely a mass in this
age group. Consider CT of the abdomen with oral and IV contrast
media.
2. No gallstones.

## 2016-05-27 ENCOUNTER — Emergency Department (HOSPITAL_COMMUNITY)
Admission: EM | Admit: 2016-05-27 | Discharge: 2016-05-27 | Disposition: A | Discharge status: Home / Self Care | Payer: 59 | Attending: Emergency Medicine | Admitting: Emergency Medicine

## 2016-05-27 ENCOUNTER — Encounter (HOSPITAL_COMMUNITY): Payer: Self-pay | Admitting: *Deleted

## 2016-05-27 DIAGNOSIS — J45909 Unspecified asthma, uncomplicated: Secondary | ICD-10-CM | POA: Insufficient documentation

## 2016-05-27 DIAGNOSIS — J029 Acute pharyngitis, unspecified: Secondary | ICD-10-CM

## 2016-05-27 LAB — RAPID STREP SCREEN (MED CTR MEBANE ONLY): Streptococcus, Group A Screen (Direct): NEGATIVE

## 2016-05-27 MED ORDER — DEXAMETHASONE SODIUM PHOSPHATE 10 MG/ML IJ SOLN
10.0000 mg | Freq: Once | INTRAMUSCULAR | Status: AC
  Administered 2016-05-27: 10 mg via INTRAMUSCULAR
  Filled 2016-05-27: qty 1

## 2016-05-27 NOTE — ED Provider Notes (Signed)
CSN: [id number]     Arrival date & time 05/27/16  1502 History  By signing my name below, I, Stephania Fragmin, attest that this documentation has been prepared under the direction and in the presence of Samantha Dowless, PA-C. Electronically Signed: Stephania Fragmin, ED Scribe. 05/27/2016. 5:24 PM.    Chief Complaint  Patient presents with  . Sore Throat   The history is provided by the patient. No language interpreter was used.    HPI Comments: Ashley Krueger is a 30 y.o. female with a history of asthma, who presents to the Emergency Department complaining of a gradual-onset, constant, burning, sore throat that began 1 week ago after spending several days staying out late in [place]. She states she initially noticed pain and tonsillar swelling on the left side of her throat, which gradually moved to the right side of her throat. She also reports noticing white patches on her tonsils. Patient states she has tried [place] powder and saltwater gargles throughout the week, which have provided moderate relief. She denies dysphagia. She also denies otalgia, fever, chills, or cough.   Past Medical History  Diagnosis Date  . Asthma     childhood asthma   History reviewed. No pertinent past surgical history. Family History  Problem Relation Age of Onset  . Rheum arthritis Other   . Thyroid disease Other   . Hypertension Other    Social History  Substance Use Topics  . Smoking status: Never Smoker   . Smokeless tobacco: Never Used  . Alcohol Use: Yes     Comment: socially   OB History    No data available     Review of Systems  Constitutional: Negative for fever and chills.  HENT: Positive for sore throat. Negative for ear pain.   Respiratory: Negative for cough.    Allergies  Review of patient's allergies indicates no known allergies.  Home Medications   Prior to Admission medications   Medication Sig Start Date End Date Taking? Authorizing Provider  ciprofloxacin (CIPRO) 500 MG tablet Take  1 tablet (500 mg total) by mouth 2 (two) times daily. One po bid x 7 days 11/29/14   Pamella Pert, MD  fluconazole (DIFLUCAN) 150 MG tablet Take 1 tablet by mouth once. 11/08/14   Historical Provider, MD  hydrOXYzine (ATARAX/VISTARIL) 50 MG tablet Take 1 tablet by mouth every 6 (six) hours as needed. itching 11/19/14   Historical Provider, MD  levonorgestrel-ethinyl estradiol (ALTAVERA) 0.15-30 MG-MCG tablet Take 1 tablet by mouth daily. 11/08/14   Historical Provider, MD  Multiple Vitamin (MULITIVITAMIN WITH MINERALS) TABS Take 1 tablet by mouth daily.    Historical Provider, MD  oxyCODONE-acetaminophen (PERCOCET) 5-325 MG per tablet Take 1 tablet by mouth every 6 (six) hours as needed for moderate pain. 11/29/14   Pamella Pert, MD  predniSONE (STERAPRED UNI-PAK) 10 MG tablet Take 1 tablet by mouth as directed. tapper down 11/19/14   Historical Provider, MD   BP 122/73 mmHg  Pulse 84  Temp(Src) 98.3 F (36.8 C) (Oral)  Resp 18  SpO2 100%  LMP 05/07/2016 Physical Exam  Constitutional: She is oriented to person, place, and time. She appears well-developed and well-nourished. No distress.  HENT:  Head: Normocephalic and atraumatic.  1small area of tonsilar exudate on left tonsil. Tonsils are not erythematous. Uvula midline. No PTA.  Eyes: Conjunctivae are normal. Right eye exhibits no discharge. Left eye exhibits no discharge. No scleral icterus.  Neck: Neck supple.  Cardiovascular: Normal rate.   Pulmonary/Chest: Effort  normal.  Lymphadenopathy:    She has no cervical adenopathy.  Neurological: She is alert and oriented to person, place, and time. Coordination normal.  Skin: Skin is warm and dry. No rash noted. She is not diaphoretic. No erythema. No pallor.  Psychiatric: She has a normal mood and affect. Her behavior is normal.  Nursing note and vitals reviewed.   ED Course  Procedures (including critical care time)  DIAGNOSTIC STUDIES: Oxygen Saturation is 100% on RA,  normal by my interpretation.    COORDINATION OF CARE: 4:52 PM - Discussed treatment plan with pt at bedside which includes rapid strep screen. Will also administer steroid injection. Advised rest and Tylenol. Pt verbalized understanding and agreed to plan.   Labs Review Labs Reviewed  RAPID STREP SCREEN (NOT AT [facility])  CULTURE, GROUP A STREP [facility])    MDM   Final diagnoses:  Viral pharyngitis   Only 1 centor criteria met. Negative strep. Diagnosis of viral pharyngitis. No abx indicated. Decadron injection administered here in ED. Pt does not appear dehydrated, but did discuss importance of water rehydration. Presentation non concerning for PTA or infxn spread to soft tissue. No trismus or uvula deviation. Specific return precautions discussed. Pt able to drink water in ED without difficulty with intact air way. Recommended PCP follow up.   I personally performed the services described in this documentation, which was scribed in my presence. The recorded information has been reviewed and is accurate.       Dondra Spry [place], PA-C 05/27/16 2026  Charlesetta Shanks, MD 06/09/16 [id number]

## 2016-05-27 NOTE — Discharge Instructions (Signed)
Sore Throat A sore throat is pain, burning, irritation, or scratchiness of the throat. There is often pain or tenderness when swallowing or talking. A sore throat may be accompanied by other symptoms, such as coughing, sneezing, fever, and swollen neck glands. A sore throat is often the first sign of another sickness, such as a cold, flu, strep throat, or mononucleosis (commonly known as mono). Most sore throats go away without medical treatment. CAUSES  The most common causes of a sore throat include:  A viral infection, such as a cold, flu, or mono.  A bacterial infection, such as strep throat, tonsillitis, or whooping cough.  Seasonal allergies.  Dryness in the air.  Irritants, such as smoke or pollution.  Gastroesophageal reflux disease (GERD). HOME CARE INSTRUCTIONS   Only take over-the-counter medicines as directed by your caregiver.  Drink enough fluids to keep your urine clear or pale yellow.  Rest as needed.  Try using throat sprays, lozenges, or sucking on hard candy to ease any pain (if older than 4 years or as directed).  Sip warm liquids, such as broth, herbal tea, or warm water with honey to relieve pain temporarily. You may also eat or drink cold or frozen liquids such as frozen ice pops.  Gargle with salt water (mix 1 tsp salt with 8 oz of water).  Do not smoke and avoid secondhand smoke.  Put a cool-mist humidifier in your bedroom at night to moisten the air. You can also turn on a hot shower and sit in the bathroom with the door closed for 5-10 minutes. SEEK IMMEDIATE MEDICAL CARE IF:  You have difficulty breathing.  You are unable to swallow fluids, soft foods, or your saliva.  You have increased swelling in the throat.  Your sore throat does not get better in 7 days.  You have nausea and vomiting.  You have a fever or persistent symptoms for more than 2-3 days.  You have a fever and your symptoms suddenly get worse. MAKE SURE YOU:   Understand  these instructions.  Will watch your condition.  Will get help right away if you are not doing well or get worse.   This information is not intended to replace advice given to you by your health care provider. Make sure you discuss any questions you have with your health care provider.  Follow up with your primary care provider if your symptoms do not improve. Encourage adequate hydration, drink plenty of fluids. Catchup on your rest. Take Tylenol or ibuprofen as needed for pain. Return to the ED if you experience significant worsening of your symptoms, fever, difficulty breathing or difficulty swallowing. You may continue gargling with salt water if this alleviates your pain.

## 2016-05-27 NOTE — ED Notes (Signed)
Pt reports sore throat which started last week.  States noticing white patches on her tonsils.

## 2016-05-30 LAB — CULTURE, GROUP A STREP (THRC)
